# Patient Record
Sex: Male | Born: 1965 | Race: White | Hispanic: No | Smoking: Never smoker
Health system: Southern US, Community
[De-identification: ages and names within clinical notes are randomized; demographics above are authoritative.]

## PROBLEM LIST (undated history)

## (undated) DIAGNOSIS — M779 Enthesopathy, unspecified: Secondary | ICD-10-CM

## (undated) DIAGNOSIS — K219 Gastro-esophageal reflux disease without esophagitis: Secondary | ICD-10-CM

## (undated) HISTORY — PX: NOSE SURGERY: SHX723

## (undated) HISTORY — PX: OTHER SURGICAL HISTORY: SHX169

---

## 2000-12-16 ENCOUNTER — Emergency Department (HOSPITAL_COMMUNITY): Admission: EM | Admit: 2000-12-16 | Discharge: 2000-12-16 | Payer: Self-pay | Admitting: *Deleted

## 2000-12-16 ENCOUNTER — Encounter: Payer: Self-pay | Admitting: *Deleted

## 2001-07-17 ENCOUNTER — Emergency Department (HOSPITAL_COMMUNITY): Admission: EM | Admit: 2001-07-17 | Discharge: 2001-07-17 | Payer: Self-pay | Admitting: Emergency Medicine

## 2001-08-04 ENCOUNTER — Ambulatory Visit (HOSPITAL_COMMUNITY): Admission: RE | Admit: 2001-08-04 | Discharge: 2001-08-04 | Payer: Self-pay | Admitting: General Surgery

## 2001-08-04 ENCOUNTER — Encounter: Payer: Self-pay | Admitting: General Surgery

## 2001-09-29 ENCOUNTER — Ambulatory Visit (HOSPITAL_COMMUNITY): Admission: RE | Admit: 2001-09-29 | Discharge: 2001-09-29 | Payer: Self-pay | Admitting: Internal Medicine

## 2001-09-29 ENCOUNTER — Encounter: Payer: Self-pay | Admitting: General Surgery

## 2001-09-30 ENCOUNTER — Emergency Department (HOSPITAL_COMMUNITY): Admission: EM | Admit: 2001-09-30 | Discharge: 2001-09-30 | Payer: Self-pay | Admitting: Internal Medicine

## 2001-10-01 ENCOUNTER — Emergency Department (HOSPITAL_COMMUNITY): Admission: EM | Admit: 2001-10-01 | Discharge: 2001-10-01 | Payer: Self-pay | Admitting: Internal Medicine

## 2001-10-02 ENCOUNTER — Encounter (HOSPITAL_COMMUNITY): Admission: RE | Admit: 2001-10-02 | Discharge: 2001-11-01 | Payer: Self-pay | Admitting: Internal Medicine

## 2004-12-04 ENCOUNTER — Emergency Department (HOSPITAL_COMMUNITY): Admission: EM | Admit: 2004-12-04 | Discharge: 2004-12-04 | Payer: Self-pay | Admitting: Emergency Medicine

## 2007-09-19 ENCOUNTER — Ambulatory Visit: Payer: Self-pay | Admitting: Cardiovascular Disease

## 2007-09-19 ENCOUNTER — Observation Stay (HOSPITAL_COMMUNITY): Admission: EM | Admit: 2007-09-19 | Discharge: 2007-09-20 | Payer: Self-pay | Admitting: Emergency Medicine

## 2007-09-20 ENCOUNTER — Encounter (INDEPENDENT_AMBULATORY_CARE_PROVIDER_SITE_OTHER): Payer: Self-pay | Admitting: Internal Medicine

## 2008-05-20 ENCOUNTER — Emergency Department (HOSPITAL_COMMUNITY): Admission: EM | Admit: 2008-05-20 | Discharge: 2008-05-20 | Payer: Self-pay | Admitting: Emergency Medicine

## 2008-09-21 ENCOUNTER — Emergency Department (HOSPITAL_COMMUNITY): Admission: EM | Admit: 2008-09-21 | Discharge: 2008-09-21 | Payer: Self-pay | Admitting: Emergency Medicine

## 2008-09-30 ENCOUNTER — Observation Stay (HOSPITAL_COMMUNITY): Admission: EM | Admit: 2008-09-30 | Discharge: 2008-10-01 | Payer: Self-pay | Admitting: Emergency Medicine

## 2009-02-13 ENCOUNTER — Emergency Department (HOSPITAL_COMMUNITY): Admission: EM | Admit: 2009-02-13 | Discharge: 2009-02-13 | Payer: Self-pay | Admitting: Emergency Medicine

## 2009-07-13 ENCOUNTER — Emergency Department (HOSPITAL_COMMUNITY): Admission: EM | Admit: 2009-07-13 | Discharge: 2009-07-13 | Payer: Self-pay | Admitting: Emergency Medicine
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
## 2010-09-11 LAB — CULTURE, ROUTINE-ABSCESS: Gram Stain: NONE SEEN

## 2010-09-16 LAB — CBC
HCT: 43.3 % (ref 39.0–52.0)
Hemoglobin: 12.8 g/dL — ABNORMAL LOW (ref 13.0–17.0)
MCHC: 34.4 g/dL (ref 30.0–36.0)
MCV: 88.8 fL (ref 78.0–100.0)
MCV: 89.2 fL (ref 78.0–100.0)
Platelets: 260 10*3/uL (ref 150–400)
RBC: 4.16 MIL/uL — ABNORMAL LOW (ref 4.22–5.81)
RBC: 4.88 MIL/uL (ref 4.22–5.81)
WBC: 7.4 10*3/uL (ref 4.0–10.5)

## 2010-09-16 LAB — DIFFERENTIAL
Basophils Absolute: 0 10*3/uL (ref 0.0–0.1)
Eosinophils Absolute: 0.2 10*3/uL (ref 0.0–0.7)
Eosinophils Absolute: 0.3 10*3/uL (ref 0.0–0.7)
Eosinophils Relative: 2 % (ref 0–5)
Eosinophils Relative: 4 % (ref 0–5)
Lymphs Abs: 2 10*3/uL (ref 0.7–4.0)
Monocytes Absolute: 0.5 10*3/uL (ref 0.1–1.0)
Monocytes Relative: 6 % (ref 3–12)
Monocytes Relative: 7 % (ref 3–12)
Neutro Abs: 2.9 10*3/uL (ref 1.7–7.7)

## 2010-09-16 LAB — URINALYSIS, ROUTINE W REFLEX MICROSCOPIC
Glucose, UA: NEGATIVE mg/dL
Ketones, ur: NEGATIVE mg/dL
Protein, ur: NEGATIVE mg/dL

## 2010-09-16 LAB — BASIC METABOLIC PANEL
BUN: 10 mg/dL (ref 6–23)
CO2: 20 mEq/L (ref 19–32)
Calcium: 7.5 mg/dL — ABNORMAL LOW (ref 8.4–10.5)
Chloride: 105 mEq/L (ref 96–112)
Chloride: 107 mEq/L (ref 96–112)
GFR calc Af Amer: >60 mL/min (ref >60)
Potassium: 3.5 mEq/L (ref 3.5–5.1)
Sodium: 139 mEq/L (ref 135–145)

## 2010-09-16 LAB — ETHANOL: Alcohol, Ethyl (B): 106 mg/dL — ABNORMAL HIGH (ref 0–10)

## 2010-09-16 LAB — CROSSMATCH
ABO/RH(D): O POS
Antibody Screen: NEGATIVE

## 2010-10-20 NOTE — Discharge Summary (Signed)
NAME:  Jonathon Baker, PARAGAS NO.:  [id number]   MEDICAL RECORD NO.:  [id number]          PATIENT TYPE:  OBV   LOCATION:  A209                          FACILITY:  [facility]   PHYSICIAN:  Skeet Latch, DO    DATE OF BIRTH:  1965/09/04   DATE OF ADMISSION:  09/19/2007  DATE OF DISCHARGE:  04/15/2009LH                               DISCHARGE SUMMARY   DISCHARGE DIAGNOSES:  1. Chest pain, probably secondary to gastroesophageal reflux disease.  2. History of recent tick bite.  3. Life stressors.   BRIEF HOSPITAL COURSE:  This is a 45 year old Caucasian male with no  significant medical history, who presented on the 14th that evening with  pain in the left side of his chest.  The patient stated it was 5-6/10 in  intensity.  The patient stated it felt like he had heartburn and  indigestion.  The patient was admitting to some heartburn-type symptoms  over the last few weeks prior to being admitted.  The patient stated it  was located in the left side of his chest, no radiation.  He was on  minimal household activity when it started.  He had no aggravating  factors or relieving factors.  The patient did state he was taking lots  of Tums and Rolaids for the last few weeks.  The patient denied any  shortness of breath, palpitations, nausea, vomiting or diaphoresis.  The  patient was given a sublingual nitroglycerin when he presented.  The  patient states that the pain started already to subside.  He did not  like the nitroglycerin tablets.  The nitroglycerin did cause some  dizziness, lightheadedness, a slight drop in blood pressure, some  bradycardia that was transient.  The patient states that the pain is  gone currently and has not returned.  The patient did mention over the  last few months he has had multiple family and social life events.  He  states that he lost his wife approximately 2 months prior and he lost  his job approximately 3 weeks ago.  The patient does admit he  has been  drinking alcohol more frequently over the last few weeks and states that  he might have some anxiety related to his life stressors.  The patient  does have three children that he takes care of at home.  The patient did  admit to a recent tick bite approximately 2 weeks prior but has had no  fever, chills or associated symptoms with that.  The patient presented  with the above problems.  Urine drug screen was performed, which was  unremarkable.  He had a chest x-ray that showed no acute findings.  EKG  showed some sinus bradycardia, normal axis, no Q waves.  The patient did  have cardiac markers, which were unremarkable.  The patient was placed  on DVT and GI prophylaxis, was placed on a PPI and pain medications.  So  far the patient's labs have all been unremarkable at this time.  We did  an amylase and lipase that was unremarkable.  Hepatic function panel was  unremarkable also.  The patient had a 2-D echocardiogram ordered.  His 2-  D echo was performed.  His results are pending at this time.  The  patient may need possible psychology evaluation for his life stressors  as an outpatient.  We will recommend that to the patient prior to being  discharged.  At this time I feel the patient is stable enough to be  discharged.  We will get social work involved regarding patient to get  some PPI that he probably needs daily for his heartburn and to talk to  him regarding if he needs any help finding a primary care physician as  well as maybe a psychologist to help him with his life matters.   VITALS ON DISCHARGE:  Temperature is 97.9, pulse 69, respirations 22,  blood pressure 134/76.  He is saturating 96% on room air.   LABS ON DISCHARGE:  Last troponin 0.02.  Lipase of 27, amylase 71.  Sodium 140, potassium 3.8, chloride 103, CO2 31, glucose 82, BUN 12,  creatinine 0.91.  White count was 7800, hemoglobin 14.1, hematocrit  40.4, platelets 271.   CONDITION ON DISCHARGE:   Stable.   DISPOSITION:  The patient will be discharged to home.   DISCHARGE MEDICATIONS:  Prilosec 20 mg one p.o. daily.   DISCHARGE INSTRUCTIONS:  The patient can do a regular diet with low  acidic-type foods, eat more, smaller meals more regularly.  He can  increase his activity to regular activities.  The patient is to get a  list of local primary physicians.  He needs to find a primary care  physician and take his medication as directed.  As mentioned, the  patient may need psychology counseling regarding his life stressors and  how to deal with them.  This will be addressed with the patient also.      Skeet Latch, DO  Electronically Signed     SM/MEDQ  D:  09/20/2007  T:  09/20/2007  Job:  [id number]
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
## 2010-10-20 NOTE — H&P (Signed)
NAME:  Jonathon Baker, Jonathon Baker NO.:  [id number]   MEDICAL RECORD NO.:  [id number]          PATIENT TYPE:  EMS   LOCATION:  ED                            FACILITY:  [facility]   PHYSICIAN:  Osvaldo Shipper, MD     DATE OF BIRTH:  1965/11/27   DATE OF ADMISSION:  09/19/2007  DATE OF DISCHARGE:  LH                              HISTORY & PHYSICAL   The patient does not have a PMD.   ADMISSION DIAGNOSES:  1. Chest pain.  2. Recent tick bite.  3. Multiple stressors in life.   CHIEF COMPLAINT:  Left-sided chest pain, onset about 6:30 p.m. this  evening.   HISTORY OF PRESENT ILLNESS:  The patient is a 45-year Caucasian male who  has no significant past medical history who was in his usual state of  health until about 6:30 this evening, when he started feeling pain in  the left side of his chest.  The pain is difficult to characterize by  the patient.  It was about 5-6/10 in intensity.  He felt like he needed  to burp.  He also admits to having some heartburn-like symptoms over the  past few weeks.  The pain was located in the left side of the chest,  with no radiation.  The patient was doing minimal household activity  while has pain started.  There were no aggravating or relieving factors.  The patient came into the ED for further evaluation.  There was no  shortness of breath, palpitation, nausea, vomiting, or diaphoresis.  The  patient was given 1 pill of sublingual nitroglycerin, with which the he  experienced dizziness, lightheadedness, drop in blood pressure, and some  bradycardia which was transient.  He also mentioned that the pain may  have decreased after that, but he had already started feeling better  since coming to the ED.  Currently, he describes the pain is about 1/10  in intensity.  He mentioned that over the past few months he has had  multiple events in his family and social life which have created a lot  of stress.  He says he lost his wife about 2 months ago.   She apparently  became short of breath and died possibly at home, and she was brought  into the ED, and she was pronounced dead.  He has three children whom he  takes care of.  He is having issues with his employment and with his  transportation issues.  The patient reports multiple tick bites about 2  weeks ago.  He denied any fever or chills after these bites.   MEDICATIONS AT HOME:  None.   ALLERGIES:  NO KNOWN DRUG ALLERGIES.   PAST MEDICAL HISTORY:  Not remarkable.   SOCIAL HISTORY:  He lives in [place], with his three children.  His  sisters help him out occasionally.  He works in Engineer, site.  He chews tobacco.  No illicit drug use.  He has been drinking  more beer than usual over the past few weeks.  He says he had a 8 beers  yesterday.  FAMILY HISTORY:  He does not really know much about his family medical  problems.  He parents have possibly some heart disease.  They are on  blood thinners.  One sister died of cancer.  Another the sister was also  diagnosed with an unknown heart disease.  So, basically family history  is really noncontributory at this point.   REVIEW OF SYSTEMS:  GENERAL:  Positive for weakness, malaise.  HEENT:  Unremarkable.  CARDIOVASCULAR:  As in HPI.  RESPIRATORY:  He also denies  any fever or cough recently.  GI: Unremarkable.  GU: Unremarkable.  NEUROLOGIC:  Unremarkable.  PSYCHIATRIC:  Unremarkable.  Other systems  unremarkable.   PHYSICAL EXAMINATION:  VITAL SIGNS:  Temperature 97.4, blood pressure  122/79, heart rate 60, respiratory rate 20s, saturation 100% on room  air.  GENERAL:  Well-developed, well-nourished white male, anxious but in no  distress.  HEENT: There is no pallor, no icterus.  Oral mucous membranes moist.  No  oral lesions are noted.  LUNGS:  Clear to auscultation bilaterally.  CARDIOVASCULAR:  S1, S2 is normal, regular.  No murmurs appreciated.  No  S3 or S4.  ABDOMEN:  Soft, nontender, nondistended.   There is slight epigastric  tenderness that is present.  The chest pain was not reproducible to  palpation.  EXTREMITIES:  Show no edema.  Peripheral pulses are palpable.  NEUROLOGIC:  The patient is alert, oriented x3.  No focal neurological  deficits are present.   LABORATORY DATA:  CBC, BMET, cardiac markers, urine drug screen all  unremarkable.  Chest x-ray did not show any acute findings.  He has EKGs  which show sinus bradycardia, with a normal axis.  Intervals appear to  be in the normal range.  No Q-waves are noted.  Nonspecific T-wave  changes are found, but no acute ST findings are noted.  Two EKGs are  stable, with no dynamic changes.   ASSESSMENT:  This is a 45 year old Caucasian male who has no significant  medical problems at having but has been having multiple stressors in  life recently, who presents with now about a 3-1/2 hour episode of chest  pain which is somewhat relieved by nitroglycerin.  His differential  diagnosis for the chest pain includes coronary artery disease.  Acid  reflux disease is a definite possibility as well.  Esophageal spasm is  also a possibility.  PE is less likely, considering his symptoms.  He  does not really have too many risk factors.  Tick bite is an interesting  history obtained here.  I do not think this is any kind of manifestation  of Lyme carditis.   PLAN:  1. Chest pain.  The patient was actually keen on going home.  I have      explained to him that since this pain was relieved somewhat by      nitroglycerin, he needs to be at least observed in the hospital      overnight, and to make sure that his cardiac markers are all      negative and that he rules out for acute coronary syndrome.      Considering his other etiologies, I will check LFTs, amylase,      lipase, and make sure that is not contributing to his sickness.  I      will also start him on a PPI.  We will repeat EKG in the morning.      An echocardiogram will also be  done in  the morning.  If the patient      indeed rules out, he can potentially be discharged tomorrow morning      with outpatient followup with cardiology for a stress test to be      considered.   I do not think his tick bite is playing any significant role in his  current presentation, but we will keep this information in mind.  I  think his stressors in life are playing a major part as in his  presentation.  He may need psych evaluation as an outpatient.   DVT and GI prophylaxis will be initiated.      Osvaldo Shipper, MD  Electronically Signed     GK/MEDQ  D:  09/19/2007  T:  09/19/2007  Job:  [id number]
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
## 2011-03-02 LAB — DIFFERENTIAL
Eosinophils Relative: 6 — ABNORMAL HIGH
Lymphocytes Relative: 39
Lymphs Abs: 3
Monocytes Absolute: 0.6
Monocytes Relative: 7

## 2011-03-02 LAB — PROTIME-INR: INR: 0.9

## 2011-03-02 LAB — POCT CARDIAC MARKERS
CKMB, poc: <1 — ABNORMAL LOW
Myoglobin, poc: 29.4
Operator id: 106841
Troponin i, poc: <0.05

## 2011-03-02 LAB — APTT: aPTT: 27

## 2011-03-02 LAB — LIPID PANEL
Cholesterol: 224 — ABNORMAL HIGH
HDL: 49
Total CHOL/HDL Ratio: 4.6
VLDL: 28

## 2011-03-02 LAB — BASIC METABOLIC PANEL
Chloride: 103
GFR calc non Af Amer: >60
Potassium: 3.8
Sodium: 140

## 2011-03-02 LAB — CBC
HCT: 40.4
Hemoglobin: 14.1
RBC: 4.61
WBC: 7.8

## 2011-03-02 LAB — HEPATIC FUNCTION PANEL
ALT: 43
AST: 30
Alkaline Phosphatase: 84
Bilirubin, Direct: <0.1
Total Bilirubin: 0.5

## 2011-03-02 LAB — RAPID URINE DRUG SCREEN, HOSP PERFORMED
Amphetamines: NOT DETECTED
Tetrahydrocannabinol: NOT DETECTED

## 2011-03-02 LAB — CARDIAC PANEL(CRET KIN+CKTOT+MB+TROPI)
Relative Index: INVALID
Total CK: 77
Troponin I: 0.01
Troponin I: 0.02

## 2011-03-02 LAB — AMYLASE: Amylase: 71

## 2011-03-11 LAB — CBC
Hemoglobin: 15 g/dL (ref 13.0–17.0)
RDW: 12.3 % (ref 11.5–15.5)
WBC: 7 10*3/uL (ref 4.0–10.5)

## 2011-03-11 LAB — DIFFERENTIAL
Basophils Absolute: 0 10*3/uL (ref 0.0–0.1)
Lymphocytes Relative: 35 % (ref 12–46)
Monocytes Absolute: 0.5 10*3/uL (ref 0.1–1.0)
Neutro Abs: 3.9 10*3/uL (ref 1.7–7.7)

## 2011-03-11 LAB — BASIC METABOLIC PANEL
Calcium: 8.9 mg/dL (ref 8.4–10.5)
GFR calc Af Amer: >60 mL/min (ref >60)
GFR calc non Af Amer: >60 mL/min (ref >60)
Glucose, Bld: 90 mg/dL (ref 70–99)
Sodium: 142 mEq/L (ref 135–145)

## 2011-03-11 LAB — POCT CARDIAC MARKERS
CKMB, poc: <1 ng/mL — ABNORMAL LOW (ref 1.0–8.0)
Troponin i, poc: <0.05 ng/mL (ref 0.00–0.09)

## 2011-10-15 ENCOUNTER — Emergency Department (HOSPITAL_COMMUNITY)
Admission: EM | Admit: 2011-10-15 | Discharge: 2011-10-15 | Disposition: A | Discharge status: Home / Self Care | Payer: Medicaid Other | Attending: Emergency Medicine | Admitting: Emergency Medicine

## 2011-10-15 ENCOUNTER — Encounter (HOSPITAL_COMMUNITY): Payer: Self-pay | Admitting: *Deleted

## 2011-10-15 DIAGNOSIS — L02419 Cutaneous abscess of limb, unspecified: Secondary | ICD-10-CM | POA: Insufficient documentation

## 2011-10-15 DIAGNOSIS — L03119 Cellulitis of unspecified part of limb: Secondary | ICD-10-CM | POA: Insufficient documentation

## 2011-10-15 DIAGNOSIS — M79609 Pain in unspecified limb: Secondary | ICD-10-CM | POA: Insufficient documentation

## 2011-10-15 DIAGNOSIS — L03116 Cellulitis of left lower limb: Secondary | ICD-10-CM

## 2011-10-15 DIAGNOSIS — Z7982 Long term (current) use of aspirin: Secondary | ICD-10-CM | POA: Insufficient documentation

## 2011-10-15 LAB — DIFFERENTIAL
Basophils Absolute: 0 10*3/uL (ref 0.0–0.1)
Basophils Relative: 0 % (ref 0–1)
Neutro Abs: 7 10*3/uL (ref 1.7–7.7)
Neutrophils Relative %: 65 % (ref 43–77)

## 2011-10-15 LAB — CBC
MCHC: 34 g/dL (ref 30.0–36.0)
RDW: 12.1 % (ref 11.5–15.5)

## 2011-10-15 LAB — BASIC METABOLIC PANEL
Chloride: 102 mEq/L (ref 96–112)
GFR calc Af Amer: >90 mL/min (ref >90)
Potassium: 3.5 mEq/L (ref 3.5–5.1)

## 2011-10-15 MED ORDER — VANCOMYCIN HCL IN DEXTROSE 1-5 GM/200ML-% IV SOLN
1000.0000 mg | Freq: Once | INTRAVENOUS | Status: AC
  Administered 2011-10-15: 1000 mg via INTRAVENOUS
  Filled 2011-10-15: qty 200

## 2011-10-15 MED ORDER — SULFAMETHOXAZOLE-TRIMETHOPRIM 800-160 MG PO TABS: 1.0000 | ORAL_TABLET | Freq: Two times a day (BID) | ORAL | Status: AC

## 2011-10-15 NOTE — ED Notes (Signed)
Wound to lt lower leg lateral aspect  present for 2-3 weeks, says he was fishing and had on a boot and sitting beside a campfire, Next  Day when removed boot, noticed tingling and pain.  Has been using epsom salt soaks,  Area is red and swollen. With swelling extending into ankle.

## 2011-10-15 NOTE — ED Notes (Signed)
Report to Susan RN.
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
## 2011-10-15 NOTE — Discharge Instructions (Signed)
Cellulitis Cellulitis is an infection of the tissue under the skin. The infected area is usually red and tender. This is caused by germs. These germs enter the body through cuts or sores. This usually happens in the arms or lower legs. HOME CARE   Take your medicine as told. Finish it even if you start to feel better.   If the infection is on the arm or leg, keep it raised (elevated).   Use a warm cloth on the infected area several times a day.   See your doctor for a follow-up visit as told.  GET HELP RIGHT AWAY IF:   You are tired or confused.   You throw up (vomit).   You have watery poop (diarrhea).   You feel ill and have muscle aches.   You have a fever.  MAKE SURE YOU:   Understand these instructions.   Will watch your condition.   Will get help right away if you are not doing well or get worse.  Document Released: 11/10/2007 Document Revised: 05/13/2011 Document Reviewed: 04/25/2009 [facility] Patient Information 2012 [place], [place].   Must clean wound daily with a shower or bath water daily. Simple dressing. Start antibiotic tomorrow twice a day for 10 days. Elevate leg. Do not walk excessively.

## 2012-06-21 ENCOUNTER — Emergency Department (HOSPITAL_COMMUNITY)
Admission: EM | Admit: 2012-06-21 | Discharge: 2012-06-22 | Disposition: A | Discharge status: Home / Self Care | Payer: Self-pay | Attending: Emergency Medicine | Admitting: Emergency Medicine

## 2012-06-21 ENCOUNTER — Encounter (HOSPITAL_COMMUNITY): Payer: Self-pay | Admitting: *Deleted

## 2012-06-21 ENCOUNTER — Emergency Department (HOSPITAL_COMMUNITY): Payer: Self-pay

## 2012-06-21 DIAGNOSIS — S02401A Maxillary fracture, unspecified, initial encounter for closed fracture: Secondary | ICD-10-CM | POA: Insufficient documentation

## 2012-06-21 DIAGNOSIS — W1809XA Striking against other object with subsequent fall, initial encounter: Secondary | ICD-10-CM | POA: Insufficient documentation

## 2012-06-21 DIAGNOSIS — Y92009 Unspecified place in unspecified non-institutional (private) residence as the place of occurrence of the external cause: Secondary | ICD-10-CM | POA: Insufficient documentation

## 2012-06-21 DIAGNOSIS — F1010 Alcohol abuse, uncomplicated: Secondary | ICD-10-CM | POA: Insufficient documentation

## 2012-06-21 DIAGNOSIS — Z79899 Other long term (current) drug therapy: Secondary | ICD-10-CM | POA: Insufficient documentation

## 2012-06-21 DIAGNOSIS — Y9389 Activity, other specified: Secondary | ICD-10-CM | POA: Insufficient documentation

## 2012-06-21 DIAGNOSIS — S0230XA Fracture of orbital floor, unspecified side, initial encounter for closed fracture: Secondary | ICD-10-CM

## 2012-06-21 DIAGNOSIS — S02400A Malar fracture unspecified, initial encounter for closed fracture: Secondary | ICD-10-CM | POA: Insufficient documentation

## 2012-06-21 DIAGNOSIS — Z7982 Long term (current) use of aspirin: Secondary | ICD-10-CM | POA: Insufficient documentation

## 2012-06-21 DIAGNOSIS — S0292XA Unspecified fracture of facial bones, initial encounter for closed fracture: Secondary | ICD-10-CM

## 2012-06-21 DIAGNOSIS — S0990XA Unspecified injury of head, initial encounter: Secondary | ICD-10-CM | POA: Insufficient documentation
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
## 2012-06-21 NOTE — ED Notes (Addendum)
Pt had syncopal episode last night, after eating oyster stew,  Facial injury Lt side of face feels numb.    Had been drinking beer and vodka. Alert, lt side of face tender and says cannot open his mouth fully.  Denies neck pain

## 2012-06-21 NOTE — ED Notes (Signed)
Pt states he had been drinking alcohol and then ate oyster stew to keep from getting sick and passed out after. Pt c/o left sided face numbness since passing out.

## 2012-06-22 ENCOUNTER — Ambulatory Visit (INDEPENDENT_AMBULATORY_CARE_PROVIDER_SITE_OTHER): Payer: Medicaid Other | Admitting: Otolaryngology

## 2012-06-22 ENCOUNTER — Encounter (HOSPITAL_COMMUNITY): Payer: Self-pay | Admitting: Pharmacy Technician

## 2012-06-22 DIAGNOSIS — S02400A Malar fracture unspecified, initial encounter for closed fracture: Secondary | ICD-10-CM

## 2012-06-23 ENCOUNTER — Other Ambulatory Visit: Payer: Self-pay | Admitting: Otolaryngology

## 2012-06-23 ENCOUNTER — Encounter (HOSPITAL_COMMUNITY): Payer: Self-pay

## 2012-06-23 NOTE — Progress Notes (Signed)
Contacted Dr. Avel Sensor office, spoke Victorino Dike, requested PAT orders.

## 2012-06-24 ENCOUNTER — Encounter (HOSPITAL_COMMUNITY)
Admission: RE | Disposition: A | Discharge status: Home / Self Care | Payer: Self-pay | Source: Ambulatory Visit | Attending: Otolaryngology

## 2012-06-24 ENCOUNTER — Ambulatory Visit (HOSPITAL_COMMUNITY): Payer: Self-pay | Admitting: Anesthesiology

## 2012-06-24 ENCOUNTER — Encounter (HOSPITAL_COMMUNITY): Payer: Self-pay | Admitting: Anesthesiology

## 2012-06-24 ENCOUNTER — Ambulatory Visit (HOSPITAL_COMMUNITY)
Admission: RE | Admit: 2012-06-24 | Discharge: 2012-06-24 | Disposition: A | Discharge status: Home / Self Care | Payer: Self-pay | Source: Ambulatory Visit | Attending: Otolaryngology | Admitting: Otolaryngology

## 2012-06-24 ENCOUNTER — Encounter (HOSPITAL_COMMUNITY): Payer: Self-pay | Admitting: *Deleted

## 2012-06-24 DIAGNOSIS — X58XXXA Exposure to other specified factors, initial encounter: Secondary | ICD-10-CM | POA: Insufficient documentation

## 2012-06-24 DIAGNOSIS — S02400A Malar fracture unspecified, initial encounter for closed fracture: Secondary | ICD-10-CM

## 2012-06-24 DIAGNOSIS — S0280XA Fracture of other specified skull and facial bones, unspecified side, initial encounter for closed fracture: Secondary | ICD-10-CM | POA: Insufficient documentation

## 2012-06-24 DIAGNOSIS — S02402A Zygomatic fracture, unspecified, initial encounter for closed fracture: Secondary | ICD-10-CM

## 2012-06-24 DIAGNOSIS — K219 Gastro-esophageal reflux disease without esophagitis: Secondary | ICD-10-CM | POA: Insufficient documentation

## 2012-06-24 DIAGNOSIS — S02401A Maxillary fracture, unspecified, initial encounter for closed fracture: Secondary | ICD-10-CM

## 2012-06-24 HISTORY — DX: Enthesopathy, unspecified: M77.9

## 2012-06-24 HISTORY — PX: ORIF TRIPOD FRACTURE: SHX5211

## 2012-06-24 HISTORY — DX: Gastro-esophageal reflux disease without esophagitis: K21.9

## 2012-06-24 LAB — BASIC METABOLIC PANEL
CO2: 26 mEq/L (ref 19–32)
Chloride: 103 mEq/L (ref 96–112)
GFR calc Af Amer: >90 mL/min (ref >90)
Potassium: 3.6 mEq/L (ref 3.5–5.1)
Sodium: 140 mEq/L (ref 135–145)

## 2012-06-24 LAB — CBC
HCT: 44.7 % (ref 39.0–52.0)
Hemoglobin: 15.4 g/dL (ref 13.0–17.0)
MCHC: 34.5 g/dL (ref 30.0–36.0)
RBC: 5.08 MIL/uL (ref 4.22–5.81)

## 2012-06-24 LAB — SURGICAL PCR SCREEN
MRSA, PCR: NEGATIVE
Staphylococcus aureus: NEGATIVE

## 2012-06-24 SURGERY — OPEN REDUCTION INTERNAL FIXATION (ORIF) TRIPOD FRACTURE
Anesthesia: General | Site: Face | Laterality: Left | Wound class: Contaminated

## 2012-06-24 MED ORDER — FENTANYL CITRATE 0.05 MG/ML IJ SOLN: 25.0000 ug | INTRAMUSCULAR | Status: DC | PRN

## 2012-06-24 MED ORDER — ONDANSETRON HCL 4 MG/2ML IJ SOLN: 4.0000 mg | Freq: Four times a day (QID) | INTRAMUSCULAR | Status: DC | PRN

## 2012-06-24 MED ORDER — LIDOCAINE-EPINEPHRINE 1 %-1:100000 IJ SOLN
INTRAMUSCULAR | Status: DC | PRN
  Administered 2012-06-24: 9 mL

## 2012-06-24 MED ORDER — LIDOCAINE HCL (CARDIAC) 20 MG/ML IV SOLN
INTRAVENOUS | Status: DC | PRN
  Administered 2012-06-24: 50 mg via INTRAVENOUS

## 2012-06-24 MED ORDER — GLYCOPYRROLATE 0.2 MG/ML IJ SOLN
INTRAMUSCULAR | Status: DC | PRN
  Administered 2012-06-24: 0.4 mg via INTRAVENOUS

## 2012-06-24 MED ORDER — OXYCODONE HCL 5 MG/5ML PO SOLN: 5.0000 mg | Freq: Once | ORAL | Status: DC | PRN

## 2012-06-24 MED ORDER — ROCURONIUM BROMIDE 100 MG/10ML IV SOLN
INTRAVENOUS | Status: DC | PRN
  Administered 2012-06-24: 50 mg via INTRAVENOUS

## 2012-06-24 MED ORDER — BSS IO SOLN
INTRAOCULAR | Status: DC | PRN
  Administered 2012-06-24: 15 mL via INTRAOCULAR

## 2012-06-24 MED ORDER — NEOSTIGMINE METHYLSULFATE 1 MG/ML IJ SOLN
INTRAMUSCULAR | Status: DC | PRN
  Administered 2012-06-24: 2 mg via INTRAVENOUS

## 2012-06-24 MED ORDER — ONDANSETRON HCL 4 MG/2ML IJ SOLN
INTRAMUSCULAR | Status: DC | PRN
  Administered 2012-06-24: 4 mg via INTRAVENOUS

## 2012-06-24 MED ORDER — MUPIROCIN 2 % EX OINT
TOPICAL_OINTMENT | CUTANEOUS | Status: AC
  Administered 2012-06-24: 07:00:00
  Filled 2012-06-24: qty 22

## 2012-06-24 MED ORDER — CEFAZOLIN SODIUM-DEXTROSE 2-3 GM-% IV SOLR
INTRAVENOUS | Status: AC
  Administered 2012-06-24: 2 g via INTRAVENOUS
  Filled 2012-06-24: qty 50

## 2012-06-24 MED ORDER — OXYCODONE HCL 5 MG PO TABS: 5.0000 mg | ORAL_TABLET | Freq: Once | ORAL | Status: DC | PRN

## 2012-06-24 MED ORDER — LACTATED RINGERS IV SOLN
INTRAVENOUS | Status: DC | PRN
  Administered 2012-06-24 (×2): via INTRAVENOUS

## 2012-06-24 MED ORDER — LIDOCAINE-EPINEPHRINE 1 %-1:100000 IJ SOLN
INTRAMUSCULAR | Status: AC
  Filled 2012-06-24: qty 1

## 2012-06-24 MED ORDER — BACITRACIN ZINC 500 UNIT/GM EX OINT
TOPICAL_OINTMENT | CUTANEOUS | Status: AC
  Filled 2012-06-24: qty 15

## 2012-06-24 MED ORDER — OXYMETAZOLINE HCL 0.05 % NA SOLN
NASAL | Status: AC
  Filled 2012-06-24: qty 15

## 2012-06-24 MED ORDER — SODIUM CHLORIDE 0.9 % IR SOLN
Status: DC | PRN
  Administered 2012-06-24: 1000 mL

## 2012-06-24 MED ORDER — FENTANYL CITRATE 0.05 MG/ML IJ SOLN
INTRAMUSCULAR | Status: DC | PRN
  Administered 2012-06-24: 150 ug via INTRAVENOUS
  Administered 2012-06-24: 100 ug via INTRAVENOUS

## 2012-06-24 MED ORDER — BSS IO SOLN
INTRAOCULAR | Status: AC
  Filled 2012-06-24: qty 15

## 2012-06-24 MED ORDER — AMOXICILLIN 875 MG PO TABS: 875.0000 mg | ORAL_TABLET | Freq: Two times a day (BID) | ORAL | Status: AC

## 2012-06-24 MED ORDER — OXYCODONE-ACETAMINOPHEN 5-325 MG PO TABS: 1.0000 | ORAL_TABLET | Freq: Four times a day (QID) | ORAL | Status: DC | PRN

## 2012-06-24 MED ORDER — PROPOFOL 10 MG/ML IV BOLUS
INTRAVENOUS | Status: DC | PRN
  Administered 2012-06-24: 20 mg via INTRAVENOUS
  Administered 2012-06-24: 180 mg via INTRAVENOUS

## 2012-06-24 SURGICAL SUPPLY — 34 items
BAG DECANTER FOR FLEXI CONT (MISCELLANEOUS) IMPLANT
BIT DRILL UPPR FCE 1.0 12 TWST (DRILL) ×1 IMPLANT
CANISTER SUCTION 2500CC (MISCELLANEOUS) ×2 IMPLANT
CLEANER TIP ELECTROSURG 2X2 (MISCELLANEOUS) ×2 IMPLANT
CLOTH BEACON ORANGE TIMEOUT ST (SAFETY) ×2 IMPLANT
CONFORMERS SILICONE 5649 (OPHTHALMIC RELATED) IMPLANT
DERMABOND ADHESIVE PROPEN (GAUZE/BANDAGES/DRESSINGS) ×1
DERMABOND ADVANCED .7 DNX6 (GAUZE/BANDAGES/DRESSINGS) ×1 IMPLANT
DRILL UPPER FACE 1.0 12M TWIST (DRILL) ×2
ELECT COATED BLADE 2.86 ST (ELECTRODE) IMPLANT
ELECT NEEDLE BLADE 2-5/6 (NEEDLE) ×2 IMPLANT
ELECT REM PT RETURN 9FT ADLT (ELECTROSURGICAL) ×2
ELECTRODE REM PT RTRN 9FT ADLT (ELECTROSURGICAL) ×1 IMPLANT
GLOVE ECLIPSE 7.5 STRL STRAW (GLOVE) ×2 IMPLANT
GOWN STRL NON-REIN LRG LVL3 (GOWN DISPOSABLE) ×4 IMPLANT
KIT BASIN OR (CUSTOM PROCEDURE TRAY) ×2 IMPLANT
KIT ROOM TURNOVER OR (KITS) ×2 IMPLANT
MASK EYE SHIELD (GAUZE/BANDAGES/DRESSINGS) ×2 IMPLANT
NS IRRIG 1000ML POUR BTL (IV SOLUTION) ×2 IMPLANT
PAD ARMBOARD 7.5X6 YLW CONV (MISCELLANEOUS) ×4 IMPLANT
PENCIL BUTTON HOLSTER BLD 10FT (ELECTRODE) ×2 IMPLANT
PLATE MID FACE 5H T (Plate) ×2 IMPLANT
SCISSORS WIRE ANG 4 3/4 DISP (INSTRUMENTS) IMPLANT
SCREW MIDFACE 1.7X3 SLF DRILL (Screw) ×8 IMPLANT
SUT PLAIN 6 0 TG1408 (SUTURE) ×2 IMPLANT
SUT PROLENE 5 0 C1 (SUTURE) IMPLANT
SUT VIC AB 3-0 FS2 27 (SUTURE) ×2 IMPLANT
TAPE SURG TRANSPORE 1 IN (GAUZE/BANDAGES/DRESSINGS) ×1 IMPLANT
TAPE SURGICAL TRANSPORE 1 IN (GAUZE/BANDAGES/DRESSINGS) ×1
TOWEL OR 17X24 6PK STRL BLUE (TOWEL DISPOSABLE) ×2 IMPLANT
TOWEL OR 17X26 10 PK STRL BLUE (TOWEL DISPOSABLE) ×2 IMPLANT
TRAY ENT MC OR (CUSTOM PROCEDURE TRAY) ×2 IMPLANT
TUBING IRRIGATION (MISCELLANEOUS) IMPLANT
WATER STERILE IRR 1000ML POUR (IV SOLUTION) IMPLANT

## 2012-06-24 NOTE — H&P (Signed)
H&P Update  Pt's original H&P dated 06/22/12 reviewed and placed in chart (to be scanned).  I personally examined the patient today.  No change in health. Proceed with open reduction and internal fixation of displaced left tripod fracture.

## 2012-06-24 NOTE — Anesthesia Postprocedure Evaluation (Signed)
Anesthesia Post Note  Patient: Jonathon Baker  Procedure(s) Performed: Procedure(s) (LRB): OPEN REDUCTION INTERNAL FIXATION (ORIF) TRIPOD FRACTURE (Left)  Anesthesia type: General  Patient location: PACU  Post pain: Pain level controlled and Adequate analgesia  Post assessment: Post-op Vital signs reviewed, Patient's Cardiovascular Status Stable, Respiratory Function Stable, Patent Airway and Pain level controlled  Last Vitals:  Filed Vitals:   06/24/12 0911  BP: 136/86  Pulse: 71  Temp: 36.9 C  Resp: 12    Post vital signs: Reviewed and stable  Level of consciousness: awake, alert  and oriented  Complications: No apparent anesthesia complications

## 2012-06-24 NOTE — Transfer of Care (Signed)
Immediate Anesthesia Transfer of Care Note  Patient: Jonathon Baker  Procedure(s) Performed: Procedure(s) (LRB) with comments: OPEN REDUCTION INTERNAL FIXATION (ORIF) TRIPOD FRACTURE (Left) - open reduction internal fixation tripod fracture   Patient Location: PACU  Anesthesia Type:General  Level of Consciousness: awake  Airway & Oxygen Therapy: Patient Spontanous Breathing  Post-op Assessment: Report given to PACU RN and Post -op Vital signs reviewed and stable  Post vital signs: Reviewed and stable  Complications: No apparent anesthesia complications

## 2012-06-24 NOTE — Anesthesia Preprocedure Evaluation (Addendum)
Anesthesia Evaluation  Patient identified by MRN, date of birth, ID band Patient awake    Reviewed: Allergy & Precautions, H&P , NPO status , Patient's Chart, lab work & pertinent test results  Airway Mallampati: II  Neck ROM: full    Dental  (+) Edentulous Upper and Dental Advisory Given   Pulmonary          Cardiovascular     Neuro/Psych  Neuromuscular disease    GI/Hepatic GERD-  ,  Endo/Other    Renal/GU      Musculoskeletal   Abdominal   Peds  Hematology   Anesthesia Other Findings etoh abuse  Reproductive/Obstetrics                          Anesthesia Physical Anesthesia Plan  ASA: II  Anesthesia Plan: General   Post-op Pain Management:    Induction: Intravenous  Airway Management Planned: Oral ETT  Additional Equipment:   Intra-op Plan:   Post-operative Plan: Extubation in OR  Informed Consent: I have reviewed the patients History and Physical, chart, labs and discussed the procedure including the risks, benefits and alternatives for the proposed anesthesia with the patient or authorized representative who has indicated his/her understanding and acceptance.     Plan Discussed with: CRNA and Surgeon  Anesthesia Plan Comments:         Anesthesia Quick Evaluation
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
## 2012-06-24 NOTE — Anesthesia Procedure Notes (Signed)
Procedure Name: Intubation Date/Time: 06/24/2012 7:53 AM Performed by: Alanda Amass A Pre-anesthesia Checklist: Patient identified, Timeout performed, Emergency Drugs available, Suction available and Patient being monitored Patient Re-evaluated:Patient Re-evaluated prior to inductionOxygen Delivery Method: Circle system utilized Preoxygenation: Pre-oxygenation with 100% oxygen Intubation Type: IV induction Ventilation: Mask ventilation with difficulty Laryngoscope Size: Mac and 3 Grade View: Grade I Tube type: Oral Tube size: 7.5 mm Number of attempts: 1 Airway Equipment and Method: Stylet Placement Confirmation: ETT inserted through vocal cords under direct vision,  breath sounds checked- equal and bilateral and positive ETCO2 Secured at: 21 cm Tube secured with: Tape Dental Injury: Teeth and Oropharynx as per pre-operative assessment

## 2012-06-24 NOTE — Brief Op Note (Signed)
06/24/2012  9:06 AM  PATIENT:  Jonathon Baker  47 y.o. male  PRE-OPERATIVE DIAGNOSIS:  Displaced and comminuted left tripod fracture   POST-OPERATIVE DIAGNOSIS:  Displaced and comminuted left Tripod Fracture  PROCEDURE:  Procedure(s) (LRB) with comments: Open reduction internal fixation of left displaced tripod fracture via multiple approaches  SURGEON:  Surgeon(s) and Role:    * Darletta Moll, MD - Primary  PHYSICIAN ASSISTANT:   ASSISTANTS: none   ANESTHESIA:   general  EBL:  Total I/O In: 1300 [I.V.:1300] Out: 5 [Blood:5]  BLOOD ADMINISTERED:none  DRAINS: none   LOCAL MEDICATIONS USED:  LIDOCAINE  and Amount: 9 ml  SPECIMEN:  No Specimen  DISPOSITION OF SPECIMEN:  N/A  COUNTS:  YES  TOURNIQUET:  * No tourniquets in log *  DICTATION: .Other Dictation: Dictation Number [id number]  PLAN OF CARE: Discharge to home after PACU  PATIENT DISPOSITION:  PACU - hemodynamically stable.   Delay start of Pharmacological VTE agent (>24hrs) due to surgical blood loss or risk of bleeding: not applicable

## 2012-06-26 ENCOUNTER — Encounter (HOSPITAL_COMMUNITY): Payer: Self-pay | Admitting: Otolaryngology

## 2012-06-29 ENCOUNTER — Ambulatory Visit (INDEPENDENT_AMBULATORY_CARE_PROVIDER_SITE_OTHER): Payer: Medicaid Other | Admitting: Otolaryngology

## 2014-02-16 ENCOUNTER — Emergency Department (HOSPITAL_COMMUNITY)
Admission: EM | Admit: 2014-02-16 | Discharge: 2014-02-16 | Disposition: A | Discharge status: Home / Self Care | Payer: Self-pay | Attending: Emergency Medicine | Admitting: Emergency Medicine

## 2014-02-16 ENCOUNTER — Encounter (HOSPITAL_COMMUNITY): Payer: Self-pay | Admitting: Emergency Medicine

## 2014-02-16 DIAGNOSIS — M79609 Pain in unspecified limb: Secondary | ICD-10-CM | POA: Insufficient documentation

## 2014-02-16 DIAGNOSIS — Z8719 Personal history of other diseases of the digestive system: Secondary | ICD-10-CM | POA: Insufficient documentation

## 2014-02-16 DIAGNOSIS — Z8739 Personal history of other diseases of the musculoskeletal system and connective tissue: Secondary | ICD-10-CM | POA: Insufficient documentation

## 2014-02-16 DIAGNOSIS — M79605 Pain in left leg: Secondary | ICD-10-CM

## 2014-02-16 LAB — BASIC METABOLIC PANEL
Anion gap: 9 (ref 5–15)
BUN: 14 mg/dL (ref 6–23)
CO2: 27 mEq/L (ref 19–32)
CREATININE: 0.78 mg/dL (ref 0.50–1.35)
Calcium: 8.4 mg/dL (ref 8.4–10.5)
Chloride: 103 mEq/L (ref 96–112)
GFR calc Af Amer: >90 mL/min (ref >90)
GLUCOSE: 102 mg/dL — AB (ref 70–99)
POTASSIUM: 3.9 meq/L (ref 3.7–5.3)
Sodium: 139 mEq/L (ref 137–147)

## 2014-02-16 LAB — D-DIMER, QUANTITATIVE: D-Dimer, Quant: <0.27 ug/mL-FEU (ref 0.00–0.48)

## 2014-02-16 MED ORDER — IBUPROFEN 600 MG PO TABS: 600.0000 mg | ORAL_TABLET | Freq: Four times a day (QID) | ORAL | Status: AC | PRN

## 2014-02-16 MED ORDER — HYDROCODONE-ACETAMINOPHEN 5-325 MG PO TABS
1.0000 | ORAL_TABLET | Freq: Once | ORAL | Status: AC
  Administered 2014-02-16: 1 via ORAL
  Filled 2014-02-16: qty 1

## 2014-02-16 MED ORDER — HYDROCODONE-ACETAMINOPHEN 5-325 MG PO TABS: 1.0000 | ORAL_TABLET | Freq: Four times a day (QID) | ORAL | Status: AC | PRN

## 2014-02-16 NOTE — ED Notes (Signed)
Pt follow up care discussed and discharge instructions given. No concerns voiced.

## 2014-02-16 NOTE — ED Provider Notes (Signed)
CSN: [id number]     Arrival date & time 02/16/14  4098 History   First MD Initiated Contact with Patient 02/16/14 0253     Chief Complaint  Patient presents with  . Leg Pain     (Consider location/radiation/quality/duration/timing/severity/associated sxs/prior Treatment) HPI  Patient presents with left flank pain. He reports left leg cramping that has worsened over the last week. He reports in general he has had pain for over a month. He denies any swelling or injury. He is worried that he may have a blood clot. Pain is worse at night. Is currently 8/10. Nothing makes it better or worse. He has tried ibuprofen at home. Patient reports increasing anxiety secondary to pain. Strong family history of blood clots.  Past Medical History  Diagnosis Date  . GERD (gastroesophageal reflux disease)     "not on any medication for at this time"  . Tendonitis     Left arm   Past Surgical History  Procedure Laterality Date  . Laceration to axilla, repair of artery    . Nose surgery      hx of  . Dental extractions      upper  . Orif tripod fracture  06/24/2012    Procedure: OPEN REDUCTION INTERNAL FIXATION (ORIF) TRIPOD FRACTURE;  Surgeon: Darletta Moll, MD;  Location: [facility] OR;  Service: ENT;  Laterality: Left;  open reduction internal fixation tripod fracture    History reviewed. No pertinent family history. History  Substance Use Topics  . Smoking status: Never Smoker   . Smokeless tobacco: Current User    Types: Chew  . Alcohol Use: Yes     Comment: "occas"    Review of Systems  Respiratory: Negative for chest tightness and shortness of breath.   Cardiovascular: Negative for leg swelling.  Musculoskeletal:       Left leg pain  Skin: Negative for color change, rash and wound.  All other systems reviewed and are negative.     Allergies  Review of patient's allergies indicates no known allergies.  Home Medications   Prior to Admission medications   Medication Sig Start Date End  Date Taking? Authorizing Provider  HYDROcodone-acetaminophen (NORCO/VICODIN) 5-325 MG per tablet Take 1 tablet by mouth every 6 (six) hours as needed for moderate pain. 02/16/14   Shon Baton, MD  ibuprofen (ADVIL,MOTRIN) 600 MG tablet Take 1 tablet (600 mg total) by mouth every 6 (six) hours as needed. 02/16/14   Shon Baton, MD   BP 119/84  Pulse 64  Temp(Src) 98 F (36.7 C)  Resp 20  Ht  (1.702 m)  Wt 150 lb (68.04 kg)  BMI 23.49 kg/m2  SpO2 100% Physical Exam  Nursing note and vitals reviewed. Constitutional: He is oriented to person, place, and time. No distress.  HENT:  Head: Normocephalic and atraumatic.  Cardiovascular: Normal rate, regular rhythm and normal heart sounds.   No murmur heard. Pulmonary/Chest: Effort normal and breath sounds normal. No respiratory distress. He has no wheezes.  Musculoskeletal: He exhibits no edema.  No Homans sign, no tenderness to palpation along the leg, no obvious deformity, no evidence of overlying skin changes  Neurological: He is alert and oriented to person, place, and time.  Skin: Skin is warm and dry. No rash noted.  Psychiatric: He has a normal mood and affect.    ED Course  Procedures (including critical care time) Labs Review Labs Reviewed  BASIC METABOLIC PANEL - Abnormal; Notable for the following:  Glucose, Bld 102 (*)    All other components within normal limits  D-DIMER, QUANTITATIVE    Imaging Review No results found.   EKG Interpretation None      MDM   Final diagnoses:  Pain of left lower extremity   Patient presents with left lower extremity pain. He is nontoxic on exam and vital signs are reassuring. Patient is concerned for blood clot. No signs of blood clot noted on exam. Screening d-dimer obtained as well as electrolytes to evaluate for potassium abnormality given the cramping. Lab work is all reassuring. D-dimer is negative. Patient was reassured. Discuss with patient pain may be  musculoskeletal in nature. Patient reports that he has been working a lot more recently and that seems to exacerbate his pain. Patient is to use ibuprofen during the day and will be given a short course of pain medication for night.  After history, exam, and medical workup I feel the patient has been appropriately medically screened and is safe for discharge home. Pertinent diagnoses were discussed with the patient. Patient was given return precautions.     Shon Baton, MD 02/16/14 [phone]

## 2014-02-16 NOTE — ED Notes (Signed)
Pt c/o lower left leg pain x 1 week.

## 2014-02-16 NOTE — Discharge Instructions (Signed)
You were seen today for leg pain.  THere is no evidence of electrolyte abnormality and your screening test for blood clots was negative.  Musculoskeletal Pain Musculoskeletal pain is muscle and boney aches and pains. These pains can occur in any part of the body. Your caregiver may treat you without knowing the cause of the pain. They may treat you if blood or urine tests, X-rays, and other tests were normal.  CAUSES There is often not a definite cause or reason for these pains. These pains may be caused by a type of germ (virus). The discomfort may also come from overuse. Overuse includes working out too hard when your body is not fit. Boney aches also come from weather changes. Bone is sensitive to atmospheric pressure changes. HOME CARE INSTRUCTIONS   Ask when your test results will be ready. Make sure you get your test results.  Only take over-the-counter or prescription medicines for pain, discomfort, or fever as directed by your caregiver. If you were given medications for your condition, do not drive, operate machinery or power tools, or sign legal documents for 24 hours. Do not drink alcohol. Do not take sleeping pills or other medications that may interfere with treatment.  Continue all activities unless the activities cause more pain. When the pain lessens, slowly resume normal activities. Gradually increase the intensity and duration of the activities or exercise.  During periods of severe pain, bed rest may be helpful. Lay or sit in any position that is comfortable.  Putting ice on the injured area.  Put ice in a bag.  Place a towel between your skin and the bag.  Leave the ice on for 15 to 20 minutes, 3 to 4 times a day.  Follow up with your caregiver for continued problems and no reason can be found for the pain. If the pain becomes worse or does not go away, it may be necessary to repeat tests or do additional testing. Your caregiver may need to look further for a possible  cause. SEEK IMMEDIATE MEDICAL CARE IF:  You have pain that is getting worse and is not relieved by medications.  You develop chest pain that is associated with shortness or breath, sweating, feeling sick to your stomach (nauseous), or throw up (vomit).  Your pain becomes localized to the abdomen.  You develop any new symptoms that seem different or that concern you. MAKE SURE YOU:   Understand these instructions.  Will watch your condition.  Will get help right away if you are not doing well or get worse. Document Released: 05/24/2005 Document Revised: 08/16/2011 Document Reviewed: 01/26/2013 [facility] Patient Information 2015 [place], [place]. This information is not intended to replace advice given to you by your health care provider. Make sure you discuss any questions you have with your health care provider.

## 2019-10-21 ENCOUNTER — Encounter (HOSPITAL_COMMUNITY): Payer: Self-pay | Admitting: Emergency Medicine

## 2019-10-21 ENCOUNTER — Other Ambulatory Visit: Payer: Self-pay

## 2019-10-21 ENCOUNTER — Emergency Department (HOSPITAL_COMMUNITY)
Admission: EM | Admit: 2019-10-21 | Discharge: 2019-10-21 | Disposition: A | Discharge status: Home / Self Care | Payer: HRSA Program | Attending: Emergency Medicine | Admitting: Emergency Medicine

## 2019-10-21 ENCOUNTER — Emergency Department (HOSPITAL_COMMUNITY): Payer: HRSA Program

## 2019-10-21 DIAGNOSIS — U071 COVID-19: Secondary | ICD-10-CM | POA: Insufficient documentation

## 2019-10-21 DIAGNOSIS — Z20822 Contact with and (suspected) exposure to covid-19: Secondary | ICD-10-CM | POA: Diagnosis not present

## 2019-10-21 DIAGNOSIS — R519 Headache, unspecified: Secondary | ICD-10-CM | POA: Insufficient documentation

## 2019-10-21 DIAGNOSIS — R05 Cough: Secondary | ICD-10-CM | POA: Diagnosis present

## 2019-10-21 DIAGNOSIS — F17220 Nicotine dependence, chewing tobacco, uncomplicated: Secondary | ICD-10-CM | POA: Insufficient documentation

## 2019-10-21 MED ORDER — ACETAMINOPHEN 500 MG PO TABS: 500.0000 mg | ORAL_TABLET | Freq: Four times a day (QID) | ORAL | 0 refills | Status: AC | PRN

## 2019-10-21 MED ORDER — BENZONATATE 100 MG PO CAPS
100.0000 mg | ORAL_CAPSULE | Freq: Three times a day (TID) | ORAL | 0 refills | Status: AC | PRN
Start: 2019-10-21 — End: ?

## 2019-10-21 NOTE — ED Triage Notes (Signed)
Pt reports fever, loss of taste for the last several days  Has  Not had vaccine due to scared of it   Here for eval

## 2019-10-21 NOTE — Discharge Instructions (Signed)
Your Covid test will result within 24 hours.  You will receive a phone call if the test is positive, no phone call if the test is negative.  You can also view your results online on MyChart.  You can take 1 to 2 tablets of Tylenol every 6 hours as needed for pain, fever.  Do not exceed 4000 mg of Tylenol daily.  I would avoid Goody's powder or ibuprofen in the meantime.  Drink plenty of water and get rest. You can take over-the-counter medications for cough such as Mucinex or Delsym.  Continue quarantining away from others until you have had symptoms for 10 days.  At that point if your symptoms are improving and you have not had any fever (without the use of ibuprofen or Tylenol) then you can stop quarantining.  Return to the emergency department if any concerning signs or symptoms develop such as high fevers, persistent vomiting, severe shortness of breath or chest pains.

## 2019-10-21 NOTE — ED Provider Notes (Signed)
Jonathon Baker   CSN: [id number] Arrival date & time: 10/21/19  2008     History Chief Complaint  Patient presents with  . ?covid sx    Jonathon Baker is a 54 y.o. male with history of GERD presenting for evaluation of acute onset, persistent loss of taste and smell, mild cough for 8 days.  Also notes intermittent mild frontal headaches.  Denies shortness of breath, chest pain, abdominal pain, nausea, vomiting.  He initially had some nasal congestion but this is improving.  He has been taking ibuprofen and Goody's powder with some relief.  He does not smoke but uses chewing tobacco.  No known sick contacts. He has been isolating at home due to his symptoms.  He is here requesting Covid testing  The history is provided by the patient.       Past Medical History:  Diagnosis Date  . GERD (gastroesophageal reflux disease)    "not on any medication for at this time"  . Tendonitis    Left arm    There are no problems to display for this patient.   Past Surgical History:  Procedure Laterality Date  . dental extractions     upper  . laceration to axilla, repair of artery    . NOSE SURGERY     hx of  . ORIF TRIPOD FRACTURE  06/24/2012   Procedure: OPEN REDUCTION INTERNAL FIXATION (ORIF) TRIPOD FRACTURE;  Surgeon: Darletta Moll, MD;  Location: [facility] OR;  Service: ENT;  Laterality: Left;  open reduction internal fixation tripod fracture        No family history on file.  Social History   Tobacco Use  . Smoking status: Never Smoker  . Smokeless tobacco: Current User    Types: Chew  Substance Use Topics  . Alcohol use: Yes    Comment: "occas"  . Drug use: No    Home Medications Prior to Admission medications   Medication Sig Start Date End Date Taking? Authorizing Provider  acetaminophen (TYLENOL) 500 MG tablet Take 1 tablet (500 mg total) by mouth every 6 (six) hours as needed. 10/21/19   Niquita Digioia A, PA-C  benzonatate (TESSALON) 100 MG  capsule Take 1 capsule (100 mg total) by mouth 3 (three) times daily as needed for cough. 10/21/19   Elissia Spiewak A, PA-C  HYDROcodone-acetaminophen (NORCO/VICODIN) 5-325 MG per tablet Take 1 tablet by mouth every 6 (six) hours as needed for moderate pain. 02/16/14   Horton, Mayer Masker, MD  ibuprofen (ADVIL,MOTRIN) 600 MG tablet Take 1 tablet (600 mg total) by mouth every 6 (six) hours as needed. 02/16/14   Horton, Mayer Masker, MD    Allergies    Patient has no known allergies.  Review of Systems   Review of Systems  Constitutional: Negative for chills and fever.  HENT: Positive for congestion.   Respiratory: Positive for cough. Negative for shortness of breath.   Cardiovascular: Negative for chest pain.  Gastrointestinal: Negative for abdominal pain, nausea and vomiting.  Neurological: Positive for headaches.  All other systems reviewed and are negative.   Physical Exam Updated Vital Signs BP (!) 134/96 (BP Location: Left Arm)   Pulse 67   Temp 98.5 F (36.9 C) (Oral)   Resp 18   Ht 5\' 7"  (1.702 m)   Wt 72.6 kg   SpO2 96%   BMI 25.06 kg/m   Physical Exam Vitals and nursing Baker reviewed.  Constitutional:      General: He  is not in acute distress.    Appearance: He is well-developed.     Comments: Resting comfortably in bed  HENT:     Head: Normocephalic and atraumatic.  Eyes:     General:        Right eye: No discharge.        Left eye: No discharge.     Conjunctiva/sclera: Conjunctivae normal.  Neck:     Vascular: No JVD.     Trachea: No tracheal deviation.  Cardiovascular:     Rate and Rhythm: Normal rate and regular rhythm.  Pulmonary:     Effort: Pulmonary effort is normal.     Breath sounds: Normal breath sounds.     Comments: Speaking in full sentences without difficulty, SPO2 saturations 96 to 100% on room air and with ambulation. Abdominal:     General: Abdomen is flat. Bowel sounds are normal. There is no distension.     Palpations: Abdomen is soft.      Tenderness: There is no abdominal tenderness. There is no guarding or rebound.  Musculoskeletal:     Cervical back: Normal range of motion and neck supple.  Skin:    General: Skin is warm and dry.     Findings: No erythema.  Neurological:     Mental Status: He is alert.  Psychiatric:        Behavior: Behavior normal.     ED Results / Procedures / Treatments   Labs (all labs ordered are listed, but only abnormal results are displayed) Labs Reviewed  SARS CORONAVIRUS 2 (TAT 6-24 HRS)    EKG None  Radiology DG Chest Portable 1 View  Result Date: 10/21/2019 CLINICAL DATA:  Cough, fever, loss of taste EXAM: PORTABLE CHEST 1 VIEW COMPARISON:  Radiograph 09/21/2008 FINDINGS: There are streaky opacities in the lung bases which are favored to reflect atelectasis given slightly low lung volumes and central vascular crowding. No consolidation, features of edema, pneumothorax, or effusion. The aorta is calcified. The remaining cardiomediastinal contours are unremarkable. No acute osseous or soft tissue abnormality. Multiple surgical clips seen in the right axillary region. IMPRESSION: Streaky opacities in the lung bases are favored to reflect atelectasis given low lung volumes. Atypical infection is felt to be significantly less likely though should be excluded on a clinical basis. No other acute cardiopulmonary abnormality. Aortic Atherosclerosis (ICD10-I70.0). Electronically Signed   By: Kreg Shropshire M.D.   On: 10/21/2019 21:16    Procedures Procedures (including critical care time)  Medications Ordered in ED Medications - No data to display  ED Course  I have reviewed the triage vital signs and the nursing notes.  Pertinent labs & imaging results that were available during my care of the patient were reviewed by me and considered in my medical decision making (see chart for details).    MDM Rules/Calculators/A&P                      Jonathon Baker was evaluated in Emergency  Department on 10/21/2019 for the symptoms described in the history of present illness. He was evaluated in the context of the global COVID-19 pandemic, which necessitated consideration that the patient might be at risk for infection with the SARS-CoV-2 virus that causes COVID-19. Institutional protocols and algorithms that pertain to the evaluation of patients at risk for COVID-19 are in a state of rapid change based on information released by regulatory bodies including the CDC and federal and state organizations. These policies and algorithms  were followed during the patient's care in the ED.  Patient presenting for evaluation of mild nonproductive cough, loss of taste and smell for 8 days.  He is afebrile, vital signs are stable.  He is nontoxic in appearance.  He exhibits no signs of respiratory distress and SPO2 saturations are stable at rest and with ambulation.  Chest x-ray shows what is likely atelectasis versus possible atypical pneumonia.  Doubt PE, ACS, dissection, cardiac tamponade, esophageal rupture, or pneumothorax outpatient Covid testing was obtained.  Clinically he is well-appearing.  We discussed quarantining at home per current CDC guidelines as well as symptomatic management.  Recommend follow-up with PCP for reevaluation of symptoms.  Discussed strict ED return precautions. Patient verbalized understanding of and agreement with plan and is safe for discharge home at this time.  Final Clinical Impression(s) / ED Diagnoses Final diagnoses:  Suspected COVID-19 virus infection    Rx / DC Orders ED Discharge Orders         Ordered    acetaminophen (TYLENOL) 500 MG tablet  Every 6 hours PRN     10/21/19 2134    benzonatate (TESSALON) 100 MG capsule  3 times daily PRN     10/21/19 2134           Debroah Baller 10/21/19 2135    Davonna Belling, MD 10/21/19 [phone]

## 2019-10-22 LAB — SARS CORONAVIRUS 2 (TAT 6-24 HRS): SARS Coronavirus 2: POSITIVE — AB

## 2019-10-23 ENCOUNTER — Telehealth (HOSPITAL_COMMUNITY): Payer: Self-pay
